# Patient Record
Sex: Female | Born: 1935 | Race: White | Hispanic: No
Health system: Southern US, Community
[De-identification: ages and names within clinical notes are randomized; demographics above are authoritative.]

## PROBLEM LIST (undated history)

## (undated) DIAGNOSIS — F329 Major depressive disorder, single episode, unspecified: Secondary | ICD-10-CM

## (undated) DIAGNOSIS — I639 Cerebral infarction, unspecified: Secondary | ICD-10-CM

## (undated) DIAGNOSIS — N289 Disorder of kidney and ureter, unspecified: Secondary | ICD-10-CM

## (undated) DIAGNOSIS — F0280 Dementia in other diseases classified elsewhere without behavioral disturbance: Secondary | ICD-10-CM

## (undated) DIAGNOSIS — G309 Alzheimer's disease, unspecified: Secondary | ICD-10-CM

---

## 2009-01-24 ENCOUNTER — Emergency Department (HOSPITAL_COMMUNITY): Admission: EM | Admit: 2009-01-24 | Discharge: 2009-01-24 | Payer: Self-pay | Admitting: Emergency Medicine

## 2009-02-26 ENCOUNTER — Inpatient Hospital Stay (HOSPITAL_COMMUNITY): Admission: EM | Admit: 2009-02-26 | Discharge: 2009-03-02 | Payer: Self-pay | Admitting: Emergency Medicine

## 2009-05-04 ENCOUNTER — Ambulatory Visit: Payer: Self-pay | Admitting: Vascular Surgery

## 2009-05-04 ENCOUNTER — Ambulatory Visit: Admission: RE | Admit: 2009-05-04 | Discharge: 2009-05-04 | Payer: Self-pay | Admitting: Internal Medicine

## 2010-04-17 LAB — COMPREHENSIVE METABOLIC PANEL
ALT: 19 U/L (ref 0–35)
Albumin: 3.7 g/dL (ref 3.5–5.2)
BUN: 31 mg/dL — ABNORMAL HIGH (ref 6–23)
CO2: 20 mEq/L (ref 19–32)
CO2: 21 mEq/L (ref 19–32)
Calcium: 8 mg/dL — ABNORMAL LOW (ref 8.4–10.5)
Calcium: 9 mg/dL (ref 8.4–10.5)
Chloride: 106 mEq/L (ref 96–112)
Creatinine, Ser: 1.64 mg/dL — ABNORMAL HIGH (ref 0.4–1.2)
Creatinine, Ser: 2.02 mg/dL — ABNORMAL HIGH (ref 0.4–1.2)
GFR calc Af Amer: 28 mL/min — ABNORMAL LOW (ref >60)
Glucose, Bld: 127 mg/dL — ABNORMAL HIGH (ref 70–99)
Total Bilirubin: 0.7 mg/dL (ref 0.3–1.2)
Total Bilirubin: 0.9 mg/dL (ref 0.3–1.2)
Total Protein: 4.9 g/dL — ABNORMAL LOW (ref 6.0–8.3)

## 2010-04-17 LAB — CBC
HCT: 30.8 % — ABNORMAL LOW (ref 36.0–46.0)
Hemoglobin: 10.4 g/dL — ABNORMAL LOW (ref 12.0–15.0)
Hemoglobin: 12.7 g/dL (ref 12.0–15.0)
MCHC: 34.3 g/dL (ref 30.0–36.0)
MCV: 107.1 fL — ABNORMAL HIGH (ref 78.0–100.0)
Platelets: 166 10*3/uL (ref 150–400)
WBC: 11.2 10*3/uL — ABNORMAL HIGH (ref 4.0–10.5)
WBC: 9.7 10*3/uL (ref 4.0–10.5)

## 2010-04-17 LAB — URINALYSIS, ROUTINE W REFLEX MICROSCOPIC
Bilirubin Urine: NEGATIVE
Glucose, UA: NEGATIVE mg/dL
Hgb urine dipstick: NEGATIVE
Ketones, ur: NEGATIVE mg/dL
Protein, ur: NEGATIVE mg/dL
Urobilinogen, UA: 0.2 mg/dL (ref 0.0–1.0)
pH: 6 (ref 5.0–8.0)

## 2010-04-17 LAB — DIFFERENTIAL
Basophils Absolute: 0 10*3/uL (ref 0.0–0.1)
Monocytes Absolute: 0.8 10*3/uL (ref 0.1–1.0)
Monocytes Relative: 7 % (ref 3–12)
Neutro Abs: 8.5 10*3/uL — ABNORMAL HIGH (ref 1.7–7.7)

## 2010-04-17 LAB — POCT CARDIAC MARKERS

## 2010-04-17 LAB — URINE CULTURE

## 2010-04-20 LAB — CBC
Hemoglobin: 10.2 g/dL — ABNORMAL LOW (ref 12.0–15.0)
MCV: 106.2 fL — ABNORMAL HIGH (ref 78.0–100.0)
RDW: 15.9 % — ABNORMAL HIGH (ref 11.5–15.5)
WBC: 7.9 10*3/uL (ref 4.0–10.5)

## 2010-04-20 LAB — BASIC METABOLIC PANEL
CO2: 19 mEq/L (ref 19–32)
Calcium: 8.1 mg/dL — ABNORMAL LOW (ref 8.4–10.5)
GFR calc non Af Amer: 27 mL/min — ABNORMAL LOW (ref >60)
Glucose, Bld: 104 mg/dL — ABNORMAL HIGH (ref 70–99)
Sodium: 137 mEq/L (ref 135–145)

## 2010-05-01 LAB — DIFFERENTIAL
Basophils Absolute: 0 10*3/uL (ref 0.0–0.1)
Basophils Relative: 0 % (ref 0–1)
Monocytes Relative: 8 % (ref 3–12)
Neutro Abs: 7 10*3/uL (ref 1.7–7.7)

## 2010-05-01 LAB — URINALYSIS, ROUTINE W REFLEX MICROSCOPIC
Glucose, UA: NEGATIVE mg/dL
Nitrite: NEGATIVE
Specific Gravity, Urine: 1.021 (ref 1.005–1.030)
pH: 6.5 (ref 5.0–8.0)

## 2010-05-01 LAB — POCT I-STAT, CHEM 8
BUN: 23 mg/dL (ref 6–23)
Calcium, Ion: 1.24 mmol/L (ref 1.12–1.32)
HCT: 28 % — ABNORMAL LOW (ref 36.0–46.0)

## 2010-05-01 LAB — URINE MICROSCOPIC-ADD ON

## 2010-05-01 LAB — CBC
Hemoglobin: 10.5 g/dL — ABNORMAL LOW (ref 12.0–15.0)
MCHC: 34.4 g/dL (ref 30.0–36.0)
Platelets: 214 10*3/uL (ref 150–400)
RBC: 2.97 MIL/uL — ABNORMAL LOW (ref 3.87–5.11)
RDW: 12.4 % (ref 11.5–15.5)
WBC: 9.4 10*3/uL (ref 4.0–10.5)

## 2010-05-01 LAB — URINE CULTURE: Colony Count: 60000

## 2010-05-01 LAB — TSH: TSH: 2.474 u[IU]/mL (ref 0.350–4.500)
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
## 2016-12-22 ENCOUNTER — Emergency Department (HOSPITAL_COMMUNITY)

## 2016-12-22 ENCOUNTER — Emergency Department (HOSPITAL_COMMUNITY)
Admission: EM | Admit: 2016-12-22 | Discharge: 2016-12-22 | Disposition: A | Discharge status: Home / Self Care | Attending: Emergency Medicine | Admitting: Emergency Medicine

## 2016-12-22 DIAGNOSIS — Y998 Other external cause status: Secondary | ICD-10-CM | POA: Insufficient documentation

## 2016-12-22 DIAGNOSIS — Y92129 Unspecified place in nursing home as the place of occurrence of the external cause: Secondary | ICD-10-CM | POA: Diagnosis not present

## 2016-12-22 DIAGNOSIS — Y939 Activity, unspecified: Secondary | ICD-10-CM | POA: Insufficient documentation

## 2016-12-22 DIAGNOSIS — F0390 Unspecified dementia without behavioral disturbance: Secondary | ICD-10-CM | POA: Diagnosis not present

## 2016-12-22 DIAGNOSIS — S0083XA Contusion of other part of head, initial encounter: Secondary | ICD-10-CM | POA: Insufficient documentation

## 2016-12-22 DIAGNOSIS — S0990XA Unspecified injury of head, initial encounter: Secondary | ICD-10-CM | POA: Diagnosis present

## 2016-12-22 DIAGNOSIS — W19XXXA Unspecified fall, initial encounter: Secondary | ICD-10-CM | POA: Diagnosis not present

## 2016-12-22 NOTE — ED Notes (Signed)
Pt's grandson updated on delays and [facility] being called

## 2016-12-22 NOTE — ED Notes (Signed)
Pt transported to CT ?

## 2016-12-22 NOTE — ED Triage Notes (Signed)
Pt brought [facility] for unwitnessed fall out of the bed from [facility]. Pt has hx of alzheimers, is a hospice pt, and is not on a blood thinner. Pt's grandson is her POA and he is at the bedside. Per facility pt is her her baseline of mentation.

## 2016-12-22 NOTE — ED Provider Notes (Signed)
[facility] Provider Note   CSN: [id number] Arrival date & time: 12/22/16  1809     History   Chief Complaint Chief Complaint  Patient presents with  . Fall    HPI Latoya Murray is a 83 y.o. female.  HPI Latoya CrockerGlennie Loescher is a 83 y.o. female with history of dementia, nonambulatory, nonverbal, presents to emergency department from a nursing home with complaint of a fall.  Patient was found on the floor around 4 PM this evening.  It is unclear how long she has been on the floor.  She was seen before around lunchtime.  Patient with a large hematoma to the left face and forehead.  No other obvious injuries.  Lucila MaineGrandson is at bedside.  No past medical history on file.  There are no active problems to display for this patient.     OB History    No data available       Home Medications    Prior to Admission medications   Not on File    Family History No family history on file.  Social History Social History   Tobacco Use  . Smoking status: Not on file  Substance Use Topics  . Alcohol use: Not on file  . Drug use: Not on file     Allergies   Patient has no allergy information on record.   Review of Systems Review of Systems  Unable to perform ROS: Dementia     Physical Exam Updated Vital Signs There were no vitals taken for this visit.  Physical Exam  Constitutional: She appears well-developed.  Cachectic, with legs contracted.  HENT:  Head: Normocephalic.  Large hematoma to the left forehead and left periorbital area.  Contusion noted over the left periorbital area.  TMs normal bilaterally, no hemotympanum.  Eyes: EOM are normal. Pupils are equal, round, and reactive to light.  Neck: Neck supple.  No significant deformity or step-offs.  Appears to be nontender, however hard to assess given patient does not respond.  Cardiovascular: Normal rate and regular rhythm.  Murmur heard. Pulmonary/Chest: Effort normal  and breath sounds normal. No stridor. No respiratory distress. She has no wheezes.  Abdominal: Soft. Bowel sounds are normal. There is no tenderness.  Neurological: She is alert.  Does not follow any directions or commands.  Able to track with her eyes.  Moving her hands bilaterally.  Skin: Skin is dry.     ED Treatments / Results  Labs (all labs ordered are listed, but only abnormal results are displayed) Labs Reviewed - No data to display  EKG  EKG Interpretation None       Radiology No results found.  Procedures Procedures (including critical care time)  Medications Ordered in ED Medications - No data to display   Initial Impression / Assessment and Plan / ED Course  I have reviewed the triage vital signs and the nursing notes.  Pertinent labs & imaging results that were available during my care of the patient were reviewed by me and considered in my medical decision making (see chart for details).     Patient is DNR.  Here from nursing home, found on the floor.  Large hematoma to the left forehead.  Grandson at bedside, requesting imaging.  Will get CT head, face, cervical spine.  Otherwise no obvious injuries.  Patient is nonambulatory at baseline, nonverbal, confused.  Pt signed out at shift change pending CT results.   Final Clinical Impressions(s) / ED Diagnoses  Final diagnoses:  None    ED Discharge Orders    None       Jaynie CrumbleKirichenko, Latreece Mochizuki, PA-C 12/23/16 1747    Jacalyn LefevreHaviland, Julie, MD 12/23/16 1751

## 2016-12-22 NOTE — ED Provider Notes (Signed)
Care [facility] from [facility] [place], pending CT results. [facility]. Pt DNR and on hospice care, accompanied by her grandson who is her POA. Plan to re-evaluate following CT results. [facility]  8:32 PM CT consistent with facial contusion and hematoma. No acute intracranial or acute cervical spine pathology. Discussed results with patient's grandson and answered any questions. Pt is resting comfortably, not in distress, [facility]  Patient discussed with Dr. Particia NearingHaviland.    Shylah Dossantos, [place] N, PA-C 12/22/16 2037    Jacalyn LefevreHaviland, Julie, MD 12/22/16 2040

## 2016-12-22 NOTE — ED Notes (Signed)
Pt picked up by [facility].  

## 2016-12-22 NOTE — Discharge Instructions (Signed)
She can have tylenol as needed for pain. Follow up with her primary care provider as needed. Return to the ER for new or concerning symptoms.

## 2017-03-02 ENCOUNTER — Other Ambulatory Visit: Payer: Self-pay

## 2017-03-02 ENCOUNTER — Emergency Department (HOSPITAL_COMMUNITY)

## 2017-03-02 ENCOUNTER — Emergency Department (HOSPITAL_COMMUNITY)
Admission: EM | Admit: 2017-03-02 | Discharge: 2017-03-02 | Disposition: A | Discharge status: Home / Self Care | Attending: Emergency Medicine | Admitting: Emergency Medicine

## 2017-03-02 ENCOUNTER — Encounter (HOSPITAL_COMMUNITY): Payer: Self-pay | Admitting: Oncology

## 2017-03-02 DIAGNOSIS — G309 Alzheimer's disease, unspecified: Secondary | ICD-10-CM | POA: Diagnosis not present

## 2017-03-02 DIAGNOSIS — W06XXXA Fall from bed, initial encounter: Secondary | ICD-10-CM | POA: Diagnosis not present

## 2017-03-02 DIAGNOSIS — W19XXXA Unspecified fall, initial encounter: Secondary | ICD-10-CM

## 2017-03-02 DIAGNOSIS — Y92122 Bedroom in nursing home as the place of occurrence of the external cause: Secondary | ICD-10-CM | POA: Diagnosis not present

## 2017-03-02 DIAGNOSIS — Y939 Activity, unspecified: Secondary | ICD-10-CM | POA: Diagnosis not present

## 2017-03-02 DIAGNOSIS — Y999 Unspecified external cause status: Secondary | ICD-10-CM | POA: Diagnosis not present

## 2017-03-02 DIAGNOSIS — S0181XA Laceration without foreign body of other part of head, initial encounter: Secondary | ICD-10-CM | POA: Insufficient documentation

## 2017-03-02 DIAGNOSIS — Z8673 Personal history of transient ischemic attack (TIA), and cerebral infarction without residual deficits: Secondary | ICD-10-CM | POA: Diagnosis not present

## 2017-03-02 DIAGNOSIS — F0280 Dementia in other diseases classified elsewhere without behavioral disturbance: Secondary | ICD-10-CM | POA: Insufficient documentation

## 2017-03-02 DIAGNOSIS — Z66 Do not resuscitate: Secondary | ICD-10-CM | POA: Diagnosis not present

## 2017-03-02 DIAGNOSIS — N183 Chronic kidney disease, stage 3 (moderate): Secondary | ICD-10-CM | POA: Diagnosis not present

## 2017-03-02 DIAGNOSIS — S0990XA Unspecified injury of head, initial encounter: Secondary | ICD-10-CM | POA: Diagnosis present

## 2017-03-02 HISTORY — DX: Disorder of kidney and ureter, unspecified: N28.9

## 2017-03-02 HISTORY — DX: Alzheimer's disease, unspecified: G30.9

## 2017-03-02 HISTORY — DX: Major depressive disorder, single episode, unspecified: F32.9

## 2017-03-02 HISTORY — DX: Cerebral infarction, unspecified: I63.9

## 2017-03-02 HISTORY — DX: Dementia in other diseases classified elsewhere, unspecified severity, without behavioral disturbance, psychotic disturbance, mood disturbance, and anxiety: F02.80

## 2017-03-02 NOTE — ED Notes (Signed)
Bed: [id number] Expected date:  Expected time:  Means of arrival:  Comments: Rm 4

## 2017-03-02 NOTE — ED Notes (Signed)
Bed: WHALB Expected date:  Expected time:  Means of arrival:  Comments: 

## 2017-03-02 NOTE — Discharge Instructions (Signed)
CT scan of head and neck were reassuring.  No fracture or head bleed.  You can call patient's primary doctor for an order for bed rails at her nursing facility.

## 2017-03-02 NOTE — ED Provider Notes (Signed)
[facility]-EMERGENCY DEPT Provider Note   CSN: [id number] Arrival date & time: 03/02/17  4098     History   Chief Complaint Chief Complaint  Patient presents with  . Fall    HPI Latoya Murray is a 84 y.o. female.  HPI   Latoya Murray is a 84yo female with a history of dementia who presents to the Emergency Department from [facility] nursing facility for evaluation following a fall out of her bed this morning.  Patient is nonambulatory and nonverbal.  Level 5 caveat applies given her dementia.  Her grandson is at the bedside and is patient's POA.  He states that he was called about 4:45AM by nursing facility staff who told him that patient sustained unwitnessed fall out of bed this morning and was bleeding from her head.  He states that patient is a DNR.  She is otherwise at her mental baseline.  She is not on blood thinners that he is aware of.  Past Medical History:  Diagnosis Date  . Alzheimer disease   . CVA (cerebral vascular accident) (HCC)   . MDD (major depressive disorder)   . Renal disorder    Stage III chronic kidney disease    There are no active problems to display for this patient.   History reviewed. No pertinent surgical history.  OB History    No data available       Home Medications    Prior to Admission medications   Not on File    Family History No family history on file.  Social History Social History   Tobacco Use  . Smoking status: Unknown If Ever Smoked  . Smokeless tobacco: Never Used  Substance Use Topics  . Alcohol use: Not on file  . Drug use: Not on file     Allergies   Patient has no known allergies.   Review of Systems Review of Systems  Unable to perform ROS: Dementia     Physical Exam Updated Vital Signs BP (!) 141/71   Pulse 87   Temp (!) 97.4 F (36.3 C) (Axillary)   Resp 16   SpO2 100%   Physical Exam  Constitutional: She appears well-developed and well-nourished. No distress.    HENT:  Head: Normocephalic and atraumatic.  Mouth/Throat: Oropharynx is clear and moist. No oropharyngeal exudate.  1cm laceration over the posterior occiput. Bleeding controlled. No racoon eyes or battle sign. Bilateral TMs with good cone of light, not hemotympanum.   Eyes: Conjunctivae are normal. Pupils are equal, round, and reactive to light. Right eye exhibits no discharge. Left eye exhibits no discharge.  Neck:  No appreciable C-spine tenderness. No step off or deformity of the C-spine.   Cardiovascular: Normal rate, regular rhythm and intact distal pulses. Exam reveals no friction rub.  No murmur heard. Pulmonary/Chest: Effort normal and breath sounds normal. No stridor. No respiratory distress. She has no wheezes.  Abdominal: Soft. Bowel sounds are normal. There is no tenderness.  Musculoskeletal:  Nontender to palpation over bilateral upper and lower extremities.  Neurological: Coordination normal.  Moving hands bilaterally. Able to follow one step commands.   Skin: Skin is warm and dry. She is not diaphoretic.  Psychiatric: She has a normal mood and affect. Her behavior is normal.  Nursing note and vitals reviewed.    ED Treatments / Results  Labs (all labs ordered are listed, but only abnormal results are displayed) Labs Reviewed - No data to display  EKG  EKG Interpretation None  Radiology Ct Head Wo Contrast  Result Date: 03/02/2017 CLINICAL DATA:  Pain after fall.  Confusion. EXAM: CT HEAD WITHOUT CONTRAST CT CERVICAL SPINE WITHOUT CONTRAST TECHNIQUE: Multidetector CT imaging of the head and cervical spine was performed following the standard protocol without intravenous contrast. Multiplanar CT image reconstructions of the cervical spine were also generated. COMPARISON:  December 22, 2016 FINDINGS: CT HEAD FINDINGS Brain: No subdural, epidural, or subarachnoid hemorrhage. Ventricular and sulcal prominence is stable. Cerebellum, brainstem, and basal cisterns  are normal. No mass effect or midline shift. A lacunar infarct in the left basal ganglia is stable. Scattered white matter changes are stable. No acute cortical ischemia or infarct. Vascular: Calcified atherosclerotic changes seen in the intracranial portions of the carotid arteries. Skull: Normal. Negative for fracture or focal lesion. Sinuses/Orbits: No acute finding. Other: Soft tissue swelling and laceration are seen along the posterior scalp. Extracranial soft tissues are otherwise normal. CT CERVICAL SPINE FINDINGS Alignment: Minimal anterolisthesis of C2 versus C3, measuring 1 or 2 mm, is unchanged since December 22, 2016 given difference in positioning no other malalignment. Skull base and vertebrae: No acute fracture. No primary bone lesion or focal pathologic process. Soft tissues and spinal canal: No prevertebral fluid or swelling. No visible canal hematoma. Disc levels:  Multilevel degenerative changes. Upper chest: Negative. Other: No other abnormalities are identified. IMPRESSION: 1. No acute intracranial abnormality. 2. Soft tissue swelling and laceration over the posterior scalp. 3. No fracture or traumatic malalignment in the cervical spine. Multilevel degenerative changes. Electronically Signed   By: Gerome Samavid  Williams III M.D   On: 03/02/2017 08:03   Ct Cervical Spine Wo Contrast  Result Date: 03/02/2017 CLINICAL DATA:  Pain after fall.  Confusion. EXAM: CT HEAD WITHOUT CONTRAST CT CERVICAL SPINE WITHOUT CONTRAST TECHNIQUE: Multidetector CT imaging of the head and cervical spine was performed following the standard protocol without intravenous contrast. Multiplanar CT image reconstructions of the cervical spine were also generated. COMPARISON:  December 22, 2016 FINDINGS: CT HEAD FINDINGS Brain: No subdural, epidural, or subarachnoid hemorrhage. Ventricular and sulcal prominence is stable. Cerebellum, brainstem, and basal cisterns are normal. No mass effect or midline shift. A lacunar infarct in  the left basal ganglia is stable. Scattered white matter changes are stable. No acute cortical ischemia or infarct. Vascular: Calcified atherosclerotic changes seen in the intracranial portions of the carotid arteries. Skull: Normal. Negative for fracture or focal lesion. Sinuses/Orbits: No acute finding. Other: Soft tissue swelling and laceration are seen along the posterior scalp. Extracranial soft tissues are otherwise normal. CT CERVICAL SPINE FINDINGS Alignment: Minimal anterolisthesis of C2 versus C3, measuring 1 or 2 mm, is unchanged since December 22, 2016 given difference in positioning no other malalignment. Skull base and vertebrae: No acute fracture. No primary bone lesion or focal pathologic process. Soft tissues and spinal canal: No prevertebral fluid or swelling. No visible canal hematoma. Disc levels:  Multilevel degenerative changes. Upper chest: Negative. Other: No other abnormalities are identified. IMPRESSION: 1. No acute intracranial abnormality. 2. Soft tissue swelling and laceration over the posterior scalp. 3. No fracture or traumatic malalignment in the cervical spine. Multilevel degenerative changes. Electronically Signed   By: Gerome Samavid  Williams III M.D   On: 03/02/2017 08:03    Procedures Procedures (including critical care time)  Medications Ordered in ED Medications - No data to display   Initial Impression / Assessment and Plan / ED Course  I have reviewed the triage vital signs and the nursing notes.  Pertinent labs & imaging  results that were available during my care of the patient were reviewed by me and considered in my medical decision making (see chart for details).    CT head without acute intracranial abnormality. CT spine without fracture. Three staples placed over posterior occipital laceration. Discussed that patient will need to have these removed in 7days. According to grandson at bedside, patient is at mental baseline. Plan to d/c patient to nursing facility.  Shared visit with Dr. Eudelia Bunch who also saw the patient and agrees with plan to discharge.   Final Clinical Impressions(s) / ED Diagnoses   Final diagnoses:  Fall, initial encounter    ED Discharge Orders    None       Lawrence Marseilles 03/02/17 1534    Nira Conn, MD 03/02/17 [phone]

## 2017-03-02 NOTE — ED Notes (Signed)
Bed: WA04 Expected date:  Expected time:  Means of arrival:  Comments: EMS- elderly fall 

## 2017-03-02 NOTE — ED Triage Notes (Signed)
Pt bib [facility] from [facility] d/t fall.  Per EMS pt has a laceration to the back of her head.  Pt is confused at baseline.  Legs are contracted.

## 2017-03-02 NOTE — ED Notes (Signed)
[facility] has taken pt back to facility

## 2017-05-29 DEATH — deceased
